# Patient Record
Sex: Male | Born: 2005 | Race: White | Hispanic: No | Marital: Single | Smoking: Never smoker
Health system: Southern US, Community
[De-identification: ages and names within clinical notes are randomized; demographics above are authoritative.]

## PROBLEM LIST (undated history)

## (undated) DIAGNOSIS — F913 Oppositional defiant disorder: Secondary | ICD-10-CM

## (undated) DIAGNOSIS — R51 Headache: Secondary | ICD-10-CM

## (undated) DIAGNOSIS — R4689 Other symptoms and signs involving appearance and behavior: Secondary | ICD-10-CM

## (undated) DIAGNOSIS — F909 Attention-deficit hyperactivity disorder, unspecified type: Secondary | ICD-10-CM

## (undated) DIAGNOSIS — F419 Anxiety disorder, unspecified: Secondary | ICD-10-CM

## (undated) HISTORY — PX: CIRCUMCISION: SHX1350

## (undated) HISTORY — DX: Other symptoms and signs involving appearance and behavior: R46.89

## (undated) HISTORY — DX: Attention-deficit hyperactivity disorder, unspecified type: F90.9

## (undated) HISTORY — DX: Oppositional defiant disorder: F91.3

## (undated) HISTORY — DX: Headache: R51

---
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
## 2024-05-08 ENCOUNTER — Other Ambulatory Visit: Payer: Self-pay

## 2024-05-08 ENCOUNTER — Emergency Department

## 2024-05-08 ENCOUNTER — Emergency Department: Admission: EM | Admit: 2024-05-08 | Discharge: 2024-05-08 | Disposition: A | Discharge status: Home / Self Care

## 2024-05-08 DIAGNOSIS — R059 Cough, unspecified: Secondary | ICD-10-CM | POA: Diagnosis present

## 2024-05-08 DIAGNOSIS — R051 Acute cough: Secondary | ICD-10-CM | POA: Diagnosis not present

## 2024-05-08 DIAGNOSIS — F17210 Nicotine dependence, cigarettes, uncomplicated: Secondary | ICD-10-CM | POA: Diagnosis not present

## 2024-05-08 LAB — RESP PANEL BY RT-PCR (RSV, FLU A&B, COVID)  RVPGX2
Influenza A by PCR: NEGATIVE
Influenza B by PCR: NEGATIVE
Resp Syncytial Virus by PCR: NEGATIVE
SARS Coronavirus 2 by RT PCR: NEGATIVE

## 2024-05-08 NOTE — ED Provider Notes (Signed)
 [facility] Provider Note    Event Date/Time   First MD Initiated Contact with Patient 05/08/24 1848     (approximate)   History   Cough   HPI  John Dawson is a 18 y.o. male who currently smokes 1 cigarette/day who presents with 2 to 3 weeks of unremitting cough and nasal congestion.  Patient presents with his significant other who contributes to history.  Patient denies any chest pain or shortness of breath.  He reports that his cough is worse at night and feels as if he cannot breathe.  He states that he sometimes chokes on the emesis.  Denies any fever, abdominal pain changes in urinary or bowel habits.      Physical Exam   Triage Vital Signs: ED Triage Vitals  Encounter Vitals Group     BP 05/08/24 1755 (!) 148/99     Girls Systolic BP Percentile --      Girls Diastolic BP Percentile --      Boys Systolic BP Percentile --      Boys Diastolic BP Percentile --      Pulse Rate 05/08/24 1755 95     Resp 05/08/24 1755 18     Temp 05/08/24 1755 98.1 F (36.7 C)     Temp Source 05/08/24 1755 Oral     SpO2 05/08/24 1755 98 %     Weight 05/08/24 1753 215 lb (97.5 kg)     Height 05/08/24 1753 5' 10 (1.778 m)     Head Circumference --      Peak Flow --      Pain Score 05/08/24 1753 0     Pain Loc --      Pain Education --      Exclude from Growth Chart --     Most recent vital signs: Vitals:   05/08/24 1755  BP: (!) 148/99  Pulse: 95  Resp: 18  Temp: 98.1 F (36.7 C)  SpO2: 98%    Nursing Triage Note reviewed. Vital signs reviewed and patients oxygen saturation is normoxic  General: Patient is well nourished, well developed, awake and alert, resting comfortably in no acute distress Head: Normocephalic and atraumatic Eyes: Normal inspection, extraocular muscles intact, no conjunctival pallor Ear, nose, throat: Normal external exam Neck: Normal range of motion Respiratory: Patient is in no respiratory distress, lungs  CTAB Cardiovascular: Patient is not tachycardic, RRR without murmur appreciated GI: Abd SNT with no guarding or rebound  Back: Normal inspection of the back with good strength and range of motion throughout all ext Extremities: pulses intact with good cap refills, no LE pitting edema or calf tenderness Neuro: The patient is alert and oriented to person, place, and time, appropriately conversive, with 5/5 bilat UE/LE strength, no gross motor or sensory defects noted. Coordination appears to be adequate. Skin: Warm, dry, and intact Psych: normal mood and affect, no SI or HI  ED Results / Procedures / Treatments   Labs (all labs ordered are listed, but only abnormal results are displayed) Labs Reviewed  RESP PANEL BY RT-PCR (RSV, FLU A&B, COVID)  [id number]     EKG None  RADIOLOGY CXR: No acute abnormality on my independent review interpretation radiologist agrees    PROCEDURES:  Critical Care performed: No  Procedures   MEDICATIONS ORDERED IN ED: Medications - No data to display   IMPRESSION / MDM / ASSESSMENT AND PLAN / ED COURSE  Differential diagnosis includes, but is not limited to, pneumonia, pleuritis, upper respiratory infection, nasal congestion  ED course: Patient is well-appearing and pulmonary exam is benin.  Chest x-ray demonstrated no evidence of pneumonia.  I do not think his presentation is consistent with acute sinusitis at this time and I do not think antibiotics are indicated.  A COVID and influenza panel was ordered for patient preference this is currently in process.  He will check the results in MyChart.  Patient was counseled on using Afrin (for 3 days at most twice daily) followed by nasal saline and room humidification.  He declines any Toradol.  He feels comfortable returning home and feels reassured   Clinical Course as of 05/08/24 2028  Fri May 08, 2024  1849 DG Chest 2 View No acute abnormality [HD]  1854 Walks  into room with steady gait [HD]  1854 Pulse Rate: 95 Borderline [HD]  1854 SpO2: 98 % Satting well on room air [HD]    Clinical Course User Index [HD] Nicholaus Rolland BRAVO, MD   At time of discharge there is no evidence of acute life, limb, vision, or fertility threat. Patient has stable vital signs, pain is well controlled, patient is ambulatory and p.o. tolerant.  Discharge instructions were completed using the EPIC system. I would refer you to those at this time. All warnings prescriptions follow-up etc. were discussed in detail with the patient. Patient indicates understanding and is agreeable with this plan. All questions answered.  Patient is made aware that they may return to the emergency department for any worsening or new condition or for any other emergency.  -- Risk: 5 This patient has a high risk of morbidity due to further diagnostic testing or treatment. Rationale: This patient's evaluation and management involve a high risk of morbidity due to the potential severity of presenting symptoms, need for diagnostic testing, and/or initiation of treatment that may require close monitoring. The differential includes conditions with potential for significant deterioration or requiring escalation of care. Treatment decisions in the ED, including medication administration, procedural interventions, or disposition planning, reflect this level of risk. COPA: 5 The patient has the following acute or chronic illness/injury that poses a possible threat to life or bodily function: [X] : The patient has a potentially serious acute condition or an acute exacerbation of a chronic illness requiring urgent evaluation and management in the Emergency Department. The clinical presentation necessitates immediate consideration of life-threatening or function-threatening diagnoses, even if they are ultimately ruled out.   FINAL CLINICAL IMPRESSION(S) / ED DIAGNOSES   Final diagnoses:  Acute cough     Rx  / DC Orders   ED Discharge Orders     None        Note:  This document was prepared using Dragon voice recognition software and may include unintentional dictation errors.   Nicholaus Rolland BRAVO, MD 05/08/24 2028

## 2024-05-08 NOTE — Discharge Instructions (Signed)
 1) for the next 3 days (do not use Afrin for more than 3 days twice a day) pick up over-the-counter Afrin and spray once in the morning and once at night, wait 1 to 2 minutes and follow this by a copious amount of over-the-counter nasal saline 2) use room humidification 3) do your best to cut down on smoking 4) alternate between over-the-counter Tylenol  and ibuprofen  as directed by the bottle Return with any acutely worsening symptoms -- RETURN PRECAUTIONS & AFTERCARE: (ENGLISH) RETURN PRECAUTIONS: Return immediately to the emergency department or see/call your doctor if you feel worse, weak or have changes in speech or vision, are short of breath, have fever, vomiting, pain, bleeding or dark stool, trouble urinating or any new issues. Return here or see/call your doctor if not improving as expected for your suspected condition. FOLLOW-UP CARE: Call your doctor and/or any doctors we referred you to for more advice and to make an appointment. Do this today, tomorrow or after the weekend. Some doctors only take PPO insurance so if you have HMO insurance you may want to contact your HMO or your regular doctor for referral to a specialist within your plan. Either way tell the doctor's office that it was a referral from the emergency department so you get the soonest possible appointment.  YOUR TEST RESULTS: Take result reports of any blood or urine tests, imaging tests and EKG's to your doctor and any referral doctor. Have any abnormal tests repeated. Your doctor or a referral doctor can let you know when this should be done. Also make sure your doctor contacts this hospital to get any test results that are not currently available such as cultures or special tests for infection and final imaging reports, which are often not available at the time you leave the ER but which may list additional important findings that are not documented on the preliminary report. BLOOD PRESSURE: If your blood pressure was greater  than 120/80 have your blood pressure rechecked within 1 to 2 weeks. MEDICATION SIDE EFFECTS: Do not drive, walk, bike, take the bus, etc. if you have received or are being prescribed any sedating medications such as those for pain or anxiety or certain antihistamines like Benadryl . If you have been give one of these here get a taxi home or have a friend drive you home. Ask your pharmacist to counsel you on potential side effects of any new medication

## 2024-05-08 NOTE — ED Triage Notes (Signed)
 Pt to ED via POV from home. Pt reports cough and congestion x3 wks. Pt reports coughing up phlegm and has been getting choked on it. Pt reports some SOB.

## 2024-06-30 ENCOUNTER — Emergency Department
Admission: EM | Admit: 2024-06-30 | Discharge: 2024-06-30 | Disposition: A | Discharge status: Home / Self Care | Attending: Emergency Medicine | Admitting: Emergency Medicine

## 2024-06-30 ENCOUNTER — Encounter: Payer: Self-pay | Admitting: Emergency Medicine

## 2024-06-30 ENCOUNTER — Emergency Department

## 2024-06-30 ENCOUNTER — Other Ambulatory Visit: Payer: Self-pay

## 2024-06-30 DIAGNOSIS — R059 Cough, unspecified: Secondary | ICD-10-CM | POA: Diagnosis present

## 2024-06-30 DIAGNOSIS — J069 Acute upper respiratory infection, unspecified: Secondary | ICD-10-CM | POA: Diagnosis not present

## 2024-06-30 DIAGNOSIS — R0981 Nasal congestion: Secondary | ICD-10-CM

## 2024-06-30 MED ORDER — ONDANSETRON 4 MG PO TBDP: 4.0000 mg | ORAL_TABLET | Freq: Four times a day (QID) | ORAL | 0 refills | Status: AC | PRN

## 2024-06-30 MED ORDER — BENZONATATE 100 MG PO CAPS: 100.0000 mg | ORAL_CAPSULE | Freq: Three times a day (TID) | ORAL | 0 refills | Status: AC | PRN

## 2024-06-30 NOTE — ED Triage Notes (Signed)
 Pt reports cough, congestion, fever that started yesterday. Has been around family that has the flu.

## 2024-06-30 NOTE — ED Provider Notes (Signed)
 "  [facility] Provider Note    None    (approximate)   History   Nasal Congestion   HPI  John Dawson is a 19 y.o. male reports no major past medical history.  1 week ago he reports that he started having cough nasal congestion that lasted a day or 2.  Seems of come back in the last day, has developed cough nasal congestion again and has noticed occasionally slightly productive cough.  No shortness of breath no nausea or vomiting.  Otherwise feels well.   Reviewed external records from [facility] Case, NP, has history of ADHD  Past Medical History:  Diagnosis Date   ADHD (attention deficit hyperactivity disorder)    Aggressive behavior    Anxiety    Headache(784.0)    ODD (oppositional defiant disorder)      Physical Exam   Triage Vital Signs: ED Triage Vitals [06/30/24 0906]  Encounter Vitals Group     BP (!) 141/89     Girls Systolic BP Percentile      Girls Diastolic BP Percentile      Boys Systolic BP Percentile      Boys Diastolic BP Percentile      Pulse Rate 93     Resp 16     Temp 97.8 F (36.6 C)     Temp Source Oral     SpO2 99 %     Weight      Height      Head Circumference      Peak Flow      Pain Score      Pain Loc      Pain Education      Exclude from Growth Chart     Most recent vital signs: Vitals:   06/30/24 0906  BP: (!) 141/89  Pulse: 93  Resp: 16  Temp: 97.8 F (36.6 C)  SpO2: 99%     General: Awake, no distress.  Occasional slight dry cough.  Mild nasal clear coryza CV:   Good peripheral perfusion. Normal rate and heart tones. Resp:   Normal effort. Lung sounds clear bilateral. Speaking without distress. Abd:   No distention.  No abdominal pain denies any nausea vomiting or diarrhea Neuro:   No focal neuro deficits noted. Moves extremities well without noted concern. Other:  Walks without difficulty   ED Results / Procedures / Treatments   Labs (all labs ordered are listed, but only  abnormal results are displayed) Labs Reviewed - No data to display  No chest pain no shortness of breath  RADIOLOGY I independently reviewed images of chest x-ray, appears clear       PROCEDURES:  Critical Care performed: No  Procedures   MEDICATIONS ORDERED IN ED: Medications - No data to display   IMPRESSION / MDM / ASSESSMENT AND PLAN / ED COURSE  I reviewed the triage vital signs and the nursing notes.                              Based on presentation, the differential diagnosis includes, but is not limited to key considerations: Patient is well-appearing nontoxic.  Reports she had mild viral-like upper respiratory type symptoms about a week ago and now reports similar symptoms.  He reports that it went to the whole family of similar nature.  He now has a slight cough occasionally productive his lungs are clear oxygen saturation normal afebrile.  There is  low risk for pneumonia by examination, but will obtain chest x-ray to exclude.  If chest x-ray normal suspect ongoing upper respiratory, possible mild bronchitis type case.  He has no evidence suggest sepsis no respiratory distress.  Does not have a complicated past medical history.  Given the symptoms of similar nature for start about a week ago not really candidate for antiviral therapy, and due to a shortage of flu testing supplies I discussed with them and will not perform a flu test today  No history of blood clots.  No findings on exam to suggest PE no pleuritic pain no hypoxia no tachycardia.  Patient's presentation is most consistent with acute illness / injury with system symptoms.  Upon reviewing the patient's chest x-ray it is clear.  At this point I do not see a strong indication for antibiotics or hospitalization.  Suspect most likely viral type illness with cough coryza.  Will treat with Tessalon  and provide Zofran  as needed in case patient were to have associated nausea during illness.   Discussed with  the patient careful return precautions.  He is understanding agreeable with this plan.  He does not have a primary care doctor, recommendation made to follow-up at urgent care in the next couple of days for reassessment.  Return to ER if any condition concerning or worsening condition like increasing shortness of breath, dehydration, chest pain etc.     FINAL CLINICAL IMPRESSION(S) / ED DIAGNOSES   Final diagnoses:  Viral upper respiratory tract infection  Nasal congestion     Rx / DC Orders   ED Discharge Orders          Ordered    ondansetron  (ZOFRAN -ODT) 4 MG disintegrating tablet  Every 6 hours PRN        06/30/24 1015    benzonatate  (TESSALON ) 100 MG capsule  3 times daily PRN        06/30/24 1015             Note:  This document was prepared using Dragon voice recognition software and may include unintentional dictation errors.   Dicky Anes, MD 06/30/24 1042  "
